# Patient Record
Sex: Male | Born: 1975 | Hispanic: Yes | Marital: Single
Health system: Southern US, Community
[De-identification: ages and names within clinical notes are randomized; demographics above are authoritative.]

---

## 2015-10-08 ENCOUNTER — Other Ambulatory Visit: Payer: Self-pay | Admitting: Neurosurgery

## 2015-10-08 DIAGNOSIS — M5416 Radiculopathy, lumbar region: Secondary | ICD-10-CM

## 2015-10-24 ENCOUNTER — Inpatient Hospital Stay: Admission: RE | Admit: 2015-10-24 | Payer: BLUE CROSS/BLUE SHIELD | Source: Ambulatory Visit

## 2015-10-26 ENCOUNTER — Other Ambulatory Visit: Payer: Self-pay | Admitting: Neurosurgery

## 2015-10-26 DIAGNOSIS — M5416 Radiculopathy, lumbar region: Secondary | ICD-10-CM

## 2015-11-12 ENCOUNTER — Ambulatory Visit
Admission: RE | Admit: 2015-11-12 | Discharge: 2015-11-12 | Disposition: A | Discharge status: Home / Self Care | Payer: BLUE CROSS/BLUE SHIELD | Source: Ambulatory Visit | Attending: Neurosurgery | Admitting: Neurosurgery

## 2015-11-12 DIAGNOSIS — M5416 Radiculopathy, lumbar region: Secondary | ICD-10-CM

## 2015-11-12 MED ORDER — METHYLPREDNISOLONE ACETATE 40 MG/ML INJ SUSP (RADIOLOG
120.0000 mg | Freq: Once | INTRAMUSCULAR | Status: AC
  Administered 2015-11-12: 120 mg via EPIDURAL

## 2015-11-12 MED ORDER — IOPAMIDOL (ISOVUE-M 200) INJECTION 41%
1.0000 mL | Freq: Once | INTRAMUSCULAR | Status: AC
  Administered 2015-11-12: 1 mL via EPIDURAL

## 2015-11-12 NOTE — Discharge Instructions (Signed)

## 2017-02-10 IMAGING — XA Imaging study
2 series · 2 of 2 positions shown · non-contrast
Comparison: MRI of the lumbar spine 08/23/2015

COMPLICATIONS:
None

CLINICAL DATA: Extra foraminal disc protrusion on the right at
L2-3. Right L2 radiculitis.

[Series 1: ortho standard · 1 of 1 slices shown (1 of 2)]
[im 1/1]
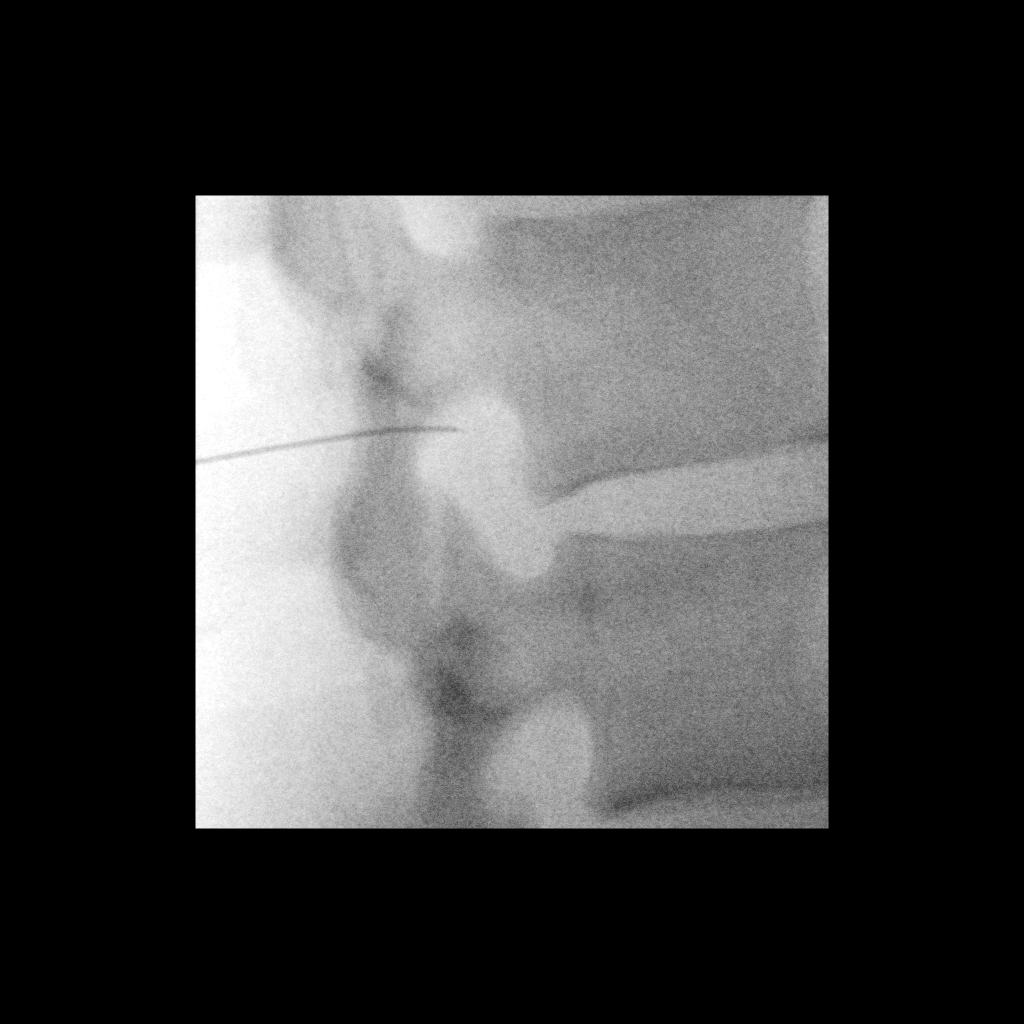

[Series 2: ortho standard · 1 of 1 slices shown (2 of 2)]
[im 1/1]
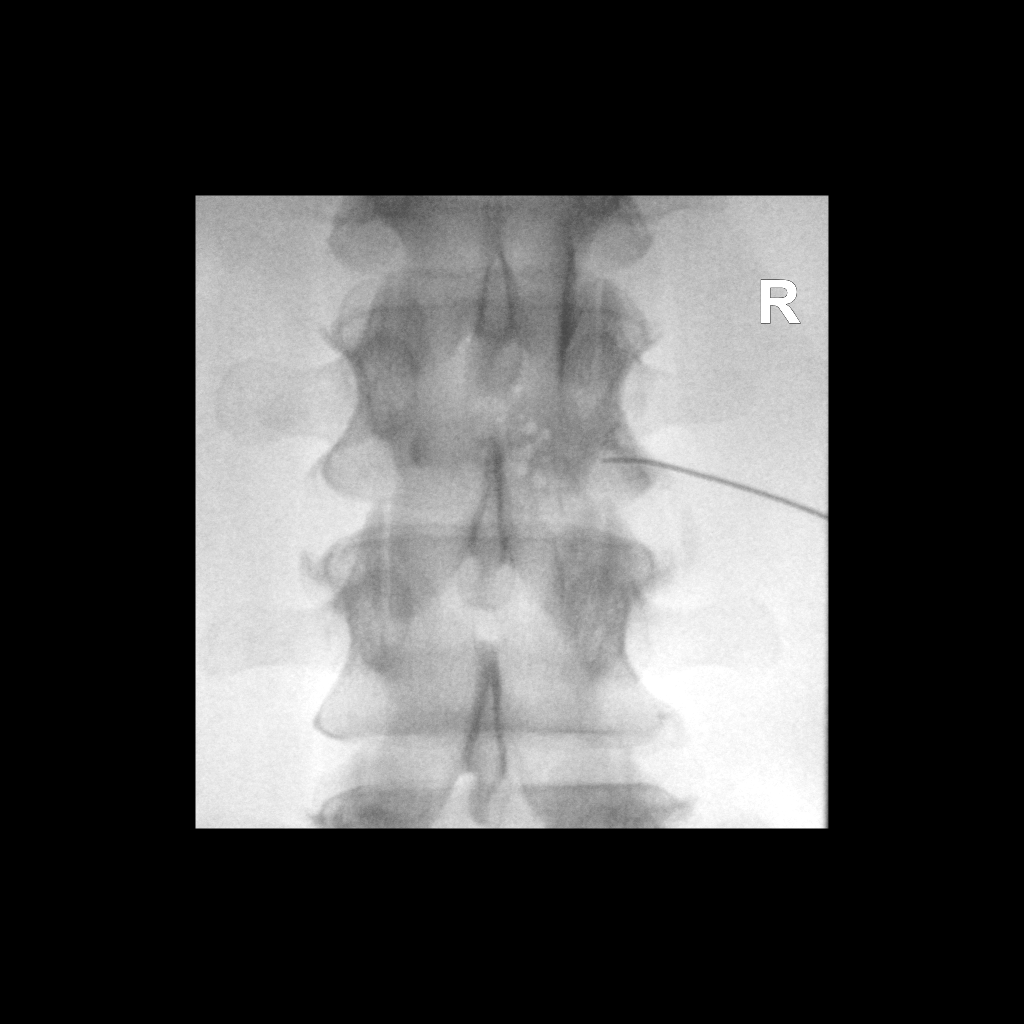

[2 of 2 positions shown; findings below may reference images not displayed]

EXAM:
DG INJECT/MINTEH INC NEEDLE/BAC/RAMBORUN EPI/LILKA/SAC W/IMG

FLUOROSCOPY TIME:  37.41 uGy*m2

PROCEDURE:
The procedure, risks, benefits, and alternatives were explained to
the patient. Questions regarding the procedure were encouraged and
answered. The patient understands and consents to the procedure.

A posterior oblique approach was taken to the intervertebral foramen
on the right at L2-3 using a curved 22 gauge spinal needle.
Injection of Isovue-M 200 outlined the right L2 nerve root and
showed good epidural spread. No vascular opacification is seen.
58MLg of Depo-Medrol mixed with 1.5 mL 1% lidocaine were instilled.
The procedure was well-tolerated, and the patient was discharged
thirty minutes following the injection in good condition.
IMPRESSION: Technically successful injection consisting of a right L2-3 nerve
root block and transforaminal epidural.

## 2017-06-01 DIAGNOSIS — J019 Acute sinusitis, unspecified: Secondary | ICD-10-CM | POA: Diagnosis not present

## 2017-06-01 DIAGNOSIS — H66009 Acute suppurative otitis media without spontaneous rupture of ear drum, unspecified ear: Secondary | ICD-10-CM | POA: Diagnosis not present

## 2017-11-06 DIAGNOSIS — R21 Rash and other nonspecific skin eruption: Secondary | ICD-10-CM | POA: Diagnosis not present

## 2017-11-06 DIAGNOSIS — E663 Overweight: Secondary | ICD-10-CM | POA: Diagnosis not present

## 2022-09-05 DIAGNOSIS — E785 Hyperlipidemia, unspecified: Secondary | ICD-10-CM | POA: Diagnosis not present

## 2022-09-05 DIAGNOSIS — I10 Essential (primary) hypertension: Secondary | ICD-10-CM | POA: Diagnosis not present

## 2022-09-05 DIAGNOSIS — Z6834 Body mass index (BMI) 34.0-34.9, adult: Secondary | ICD-10-CM | POA: Diagnosis not present

## 2022-09-05 DIAGNOSIS — E1169 Type 2 diabetes mellitus with other specified complication: Secondary | ICD-10-CM | POA: Diagnosis not present

## 2022-11-07 DIAGNOSIS — E119 Type 2 diabetes mellitus without complications: Secondary | ICD-10-CM | POA: Diagnosis not present

## 2022-11-20 DIAGNOSIS — I10 Essential (primary) hypertension: Secondary | ICD-10-CM | POA: Diagnosis not present

## 2022-11-20 DIAGNOSIS — E1169 Type 2 diabetes mellitus with other specified complication: Secondary | ICD-10-CM | POA: Diagnosis not present

## 2022-11-20 DIAGNOSIS — Z6832 Body mass index (BMI) 32.0-32.9, adult: Secondary | ICD-10-CM | POA: Diagnosis not present

## 2022-11-20 DIAGNOSIS — E785 Hyperlipidemia, unspecified: Secondary | ICD-10-CM | POA: Diagnosis not present

## 2022-11-20 DIAGNOSIS — Z6834 Body mass index (BMI) 34.0-34.9, adult: Secondary | ICD-10-CM | POA: Diagnosis not present
